# Patient Record
Sex: Female | Born: 1945 | Race: White | Hispanic: No | Marital: Single | Smoking: Never smoker
Health system: Southern US, Community
[De-identification: ages and names within clinical notes are randomized; demographics above are authoritative.]

## PROBLEM LIST (undated history)

## (undated) DIAGNOSIS — I10 Essential (primary) hypertension: Secondary | ICD-10-CM

## (undated) DIAGNOSIS — G4730 Sleep apnea, unspecified: Secondary | ICD-10-CM

## (undated) DIAGNOSIS — B029 Zoster without complications: Secondary | ICD-10-CM

## (undated) DIAGNOSIS — G2581 Restless legs syndrome: Secondary | ICD-10-CM

---

## 2014-07-21 ENCOUNTER — Emergency Department (HOSPITAL_BASED_OUTPATIENT_CLINIC_OR_DEPARTMENT_OTHER)
Admission: EM | Admit: 2014-07-21 | Discharge: 2014-07-21 | Disposition: A | Discharge status: Home / Self Care | Payer: Medicare Other | Attending: Emergency Medicine | Admitting: Emergency Medicine

## 2014-07-21 ENCOUNTER — Encounter (HOSPITAL_BASED_OUTPATIENT_CLINIC_OR_DEPARTMENT_OTHER): Payer: Self-pay | Admitting: Adult Health

## 2014-07-21 ENCOUNTER — Emergency Department (HOSPITAL_BASED_OUTPATIENT_CLINIC_OR_DEPARTMENT_OTHER): Payer: Medicare Other

## 2014-07-21 DIAGNOSIS — Z79899 Other long term (current) drug therapy: Secondary | ICD-10-CM | POA: Insufficient documentation

## 2014-07-21 DIAGNOSIS — G2581 Restless legs syndrome: Secondary | ICD-10-CM | POA: Insufficient documentation

## 2014-07-21 DIAGNOSIS — I10 Essential (primary) hypertension: Secondary | ICD-10-CM | POA: Diagnosis not present

## 2014-07-21 DIAGNOSIS — R1011 Right upper quadrant pain: Secondary | ICD-10-CM | POA: Insufficient documentation

## 2014-07-21 DIAGNOSIS — Z792 Long term (current) use of antibiotics: Secondary | ICD-10-CM | POA: Insufficient documentation

## 2014-07-21 DIAGNOSIS — K449 Diaphragmatic hernia without obstruction or gangrene: Secondary | ICD-10-CM | POA: Insufficient documentation

## 2014-07-21 DIAGNOSIS — R350 Frequency of micturition: Secondary | ICD-10-CM | POA: Insufficient documentation

## 2014-07-21 DIAGNOSIS — R110 Nausea: Secondary | ICD-10-CM

## 2014-07-21 DIAGNOSIS — Z8619 Personal history of other infectious and parasitic diseases: Secondary | ICD-10-CM | POA: Diagnosis not present

## 2014-07-21 HISTORY — DX: Essential (primary) hypertension: I10

## 2014-07-21 HISTORY — DX: Zoster without complications: B02.9

## 2014-07-21 HISTORY — DX: Restless legs syndrome: G25.81

## 2014-07-21 HISTORY — DX: Sleep apnea, unspecified: G47.30

## 2014-07-21 LAB — CBC WITH DIFFERENTIAL/PLATELET
BASOS ABS: 0 10*3/uL (ref 0.0–0.1)
Basophils Relative: 0 % (ref 0–1)
EOS ABS: 0.2 10*3/uL (ref 0.0–0.7)
Eosinophils Relative: 2 % (ref 0–5)
HEMATOCRIT: 42.3 % (ref 36.0–46.0)
Hemoglobin: 14.3 g/dL (ref 12.0–15.0)
LYMPHS PCT: 21 % (ref 12–46)
Lymphs Abs: 2.1 10*3/uL (ref 0.7–4.0)
MCH: 29.9 pg (ref 26.0–34.0)
MCHC: 33.8 g/dL (ref 30.0–36.0)
MCV: 88.3 fL (ref 78.0–100.0)
Monocytes Absolute: 0.6 10*3/uL (ref 0.1–1.0)
Monocytes Relative: 6 % (ref 3–12)
NEUTROS ABS: 7 10*3/uL (ref 1.7–7.7)
NEUTROS PCT: 71 % (ref 43–77)
Platelets: 260 10*3/uL (ref 150–400)
RBC: 4.79 MIL/uL (ref 3.87–5.11)
RDW: 13.1 % (ref 11.5–15.5)
WBC: 9.9 10*3/uL (ref 4.0–10.5)

## 2014-07-21 LAB — URINALYSIS, ROUTINE W REFLEX MICROSCOPIC
Bilirubin Urine: NEGATIVE
Glucose, UA: NEGATIVE mg/dL
KETONES UR: NEGATIVE mg/dL
NITRITE: NEGATIVE
PROTEIN: NEGATIVE mg/dL
Specific Gravity, Urine: 1.008 (ref 1.005–1.030)
UROBILINOGEN UA: 0.2 mg/dL (ref 0.0–1.0)
pH: 7.5 (ref 5.0–8.0)

## 2014-07-21 LAB — LIPASE, BLOOD: Lipase: 18 U/L — ABNORMAL LOW (ref 22–51)

## 2014-07-21 LAB — TROPONIN I: Troponin I: <0.03 ng/mL (ref <0.031)

## 2014-07-21 LAB — COMPREHENSIVE METABOLIC PANEL
ALBUMIN: 4.3 g/dL (ref 3.5–5.0)
ALT: 16 U/L (ref 14–54)
ANION GAP: 12 (ref 5–15)
AST: 19 U/L (ref 15–41)
Alkaline Phosphatase: 65 U/L (ref 38–126)
BUN: 12 mg/dL (ref 6–20)
CO2: 25 mmol/L (ref 22–32)
Calcium: 9.9 mg/dL (ref 8.9–10.3)
Chloride: 104 mmol/L (ref 101–111)
Creatinine, Ser: 0.62 mg/dL (ref 0.44–1.00)
GFR calc Af Amer: >60 mL/min (ref >60)
Glucose, Bld: 122 mg/dL — ABNORMAL HIGH (ref 65–99)
POTASSIUM: 3.9 mmol/L (ref 3.5–5.1)
Sodium: 141 mmol/L (ref 135–145)
Total Bilirubin: 0.9 mg/dL (ref 0.3–1.2)
Total Protein: 7.7 g/dL (ref 6.5–8.1)

## 2014-07-21 LAB — URINE MICROSCOPIC-ADD ON

## 2014-07-21 MED ORDER — ONDANSETRON HCL 4 MG/2ML IJ SOLN
4.0000 mg | Freq: Once | INTRAMUSCULAR | Status: AC
  Administered 2014-07-21: 4 mg via INTRAVENOUS
  Filled 2014-07-21: qty 2

## 2014-07-21 MED ORDER — IOHEXOL 300 MG/ML  SOLN
25.0000 mL | Freq: Once | INTRAMUSCULAR | Status: AC | PRN
  Administered 2014-07-21: 25 mL via ORAL

## 2014-07-21 MED ORDER — SODIUM CHLORIDE 0.9 % IV BOLUS (SEPSIS)
1000.0000 mL | Freq: Once | INTRAVENOUS | Status: AC
  Administered 2014-07-21: 1000 mL via INTRAVENOUS

## 2014-07-21 MED ORDER — IOHEXOL 300 MG/ML  SOLN
100.0000 mL | Freq: Once | INTRAMUSCULAR | Status: AC | PRN
  Administered 2014-07-21: 100 mL via INTRAVENOUS

## 2014-07-21 NOTE — ED Notes (Signed)
Pt reports that "the nausea is back.." dr. Donnald GarrePfeiffer alerted.

## 2014-07-21 NOTE — ED Provider Notes (Signed)
CSN: [id number]     Arrival date & time 07/21/14  [id number] History   First MD Initiated Contact with Patient 07/21/14 [phone]     Chief Complaint  Patient presents with  . Abdominal Pain  . Nausea     Patient is a 69 y.o. female presenting with weakness. The history is provided by the patient.  Weakness This is a new problem. The current episode started 6 to 12 hours ago. The problem has been resolved. Associated symptoms include abdominal pain. Pertinent negatives include no chest pain, no headaches and no shortness of breath. Nothing aggravates the symptoms. Nothing relieves the symptoms.  Pt reports last evening (over 6 hours ago) she had onset of "Feeling hot" she had nausea and felt generalized weakness.  She denies CP and denies any new SOB. She also reports RUQ pain for about 2 weeks She also reports urinary frequency No vomiting.  She reports frequent stools No focal weakness  She was diagnosed with shingles over 2 weeks ago (located on buttocks) and has been on hydrocodone for pain and also valtrex and also keflex She reports the shingles is improved  She reports when the episode occurred last night she had just taken her requip and also keflex  She denies h/o CAD/CVA  surg hx - cholecystectomy  Past Medical History  Diagnosis Date  . Shingles   . Sleep apnea   . Hypertension   . Restless leg syndrome    History reviewed. No pertinent past surgical history. History reviewed. No pertinent family history. History  Substance Use Topics  . Smoking status: Never Smoker   . Smokeless tobacco: Not on file  . Alcohol Use: No   OB History    No data available     Review of Systems  Respiratory: Negative for shortness of breath.   Cardiovascular: Negative for chest pain.  Gastrointestinal: Positive for abdominal pain.  Genitourinary: Positive for frequency.  Neurological: Positive for weakness. Negative for syncope, speech difficulty and headaches.  All other systems  reviewed and are negative.     Allergies  Review of patient's allergies indicates no known allergies.  Home Medications   Prior to Admission medications   Medication Sig Start Date End Date Taking? Authorizing Provider  calcium carbonate (TUMS - DOSED IN MG ELEMENTAL CALCIUM) 500 MG chewable tablet Chew 1 tablet by mouth daily.   Yes Historical Provider, MD  cephALEXin (KEFLEX) 500 MG capsule Take 500 mg by mouth 4 (four) times daily.   Yes Historical Provider, MD  HYDROcodone-acetaminophen (NORCO) 7.5-325 MG per tablet Take 1 tablet by mouth every 6 (six) hours as needed for moderate pain.   Yes Historical Provider, MD  ibuprofen (ADVIL,MOTRIN) 200 MG tablet Take 200 mg by mouth every 6 (six) hours as needed.   Yes Historical Provider, MD  irbesartan (AVAPRO) 150 MG tablet Take 150 mg by mouth daily.   Yes Historical Provider, MD  rOPINIRole (REQUIP) 0.5 MG tablet Take 0.5 mg by mouth 3 (three) times daily.   Yes Historical Provider, MD  valACYclovir (VALTREX) 1000 MG tablet Take 1,000 mg by mouth 2 (two) times daily.   Yes Historical Provider, MD   BP 181/93 mmHg  Pulse 67  Temp(Src) 99 F (37.2 C) (Oral)  Resp 20  Ht 5\' 4"  (1.626 m)  Wt 220 lb (99.791 kg)  BMI 37.74 kg/m2  SpO2 98% Physical Exam CONSTITUTIONAL: Well developed/well nourished HEAD: Normocephalic/atraumatic EYES: EOMI/PERRL ENMT: Mucous membranes moist NECK: supple no meningeal signs SPINE/BACK:entire spine  nontender CV: S1/S2 noted, no murmurs/rubs/gallops noted LUNGS: Lungs are clear to auscultation bilaterally, no apparent distress ABDOMEN: soft, mild RUQ tenderness, no rebound or guarding, bowel sounds noted throughout abdomen GU:no cva tenderness NEURO: Pt is awake/alert/appropriate, moves all extremitiesx4.  No facial droop.  No arm/leg drift EXTREMITIES: pulses normal/equal, full ROM.   SKIN: warm, color normal.  Rash on buttocks that appears to be improving, minimal erythema noted.  Nurse present for  exam PSYCH: no abnormalities of mood noted, alert and oriented to situation  ED Course  Procedures   6:58 AM Pt presents with 2 episodes of "feeling hot" and nausea during the night While in the room she reported an episode soon after my exam Her vitals remained unchanged Unclear cause of nausea/warmth/flushing.  She thought it might be med related but unclear cause at this time  D/w dr Donnald Garre at signout to f/u on labs and reassess patient.  Labs Review Labs Reviewed  COMPREHENSIVE METABOLIC PANEL  CBC WITH DIFFERENTIAL/PLATELET  LIPASE, BLOOD  TROPONIN I  URINALYSIS, ROUTINE W REFLEX MICROSCOPIC (NOT AT [facility])    Imaging Review No results found.   EKG Interpretation   Date/Time:  Monday July 21 2014 06:53:47 EDT Ventricular Rate:  58 PR Interval:  126 QRS Duration: 90 QT Interval:  432 QTC Calculation: 424 R Axis:   12 Text Interpretation:  Sinus bradycardia Nonspecific ST and T wave  abnormality Abnormal ECG No previous ECGs available Confirmed by Bebe Shaggy   MD, Dorinda Hill ([id number]) on 07/21/2014 6:56:19 AM      MDM   Final diagnoses:  None    Nursing notes including past medical history and social history reviewed and considered in documentation     Zadie Rhine, MD 07/21/14 [phone]

## 2014-07-21 NOTE — ED Notes (Signed)
MD at bedside. 

## 2014-07-21 NOTE — ED Notes (Signed)
Pt assisted to reposition, given ice water per request. Pt daughter phoned, will pick up pt shortly, pt states she does not feel well enough to wait in the waiting room.

## 2014-07-21 NOTE — ED Provider Notes (Signed)
I assumed care of the patient for Dr. Bebe ShaggyWickline. I interviewed the patient and reviewed her history present illness with episodes of nausea and weakness starting yesterday evening. The patient identified abdominal pain in her epigastrium that has been significantly bothering her for 2 or more weeks. She had been started on Keflex to prophylax a shingles rash that she had on her buttock. She had not developed vomiting or diarrhea. She has however had abdominal pain that had aching and pressure quality in the epigastrium. She reports she has had problems with reflux and has had a scope some 3 years ago. She states that she takes Prilosec daily. She reports that usually controls her symptoms for her.  On examination the patient was alert and appropriate. No respiratory distress. Cardiovascular examination is normal. Bilateral lung sounds clear without wheeze rhonchi rail. Patient endorses significant epigastric pain to palpation. No lower extremity symptoms. No pulse deficit, no edema, no calf tenderness.  Diagnostic results showed normal EKG and negative troponin. CT of the abdomen confirms a moderate to large hiatal hernia. She was moderately hypertensive upon arrival however this normalized without intervention. At this time symptoms appear most consistent with her hiatal hernia. This may have been exacerbated with the use of antibiotics.  After examining her rash on the buttock, I do not feel that she needs further Keflex. I will have her discontinue the Keflex. She is taking Prilosec daily. She is to continue this and may increase that to 40 mg a day for the next week. The patient is given instructions for follow-up. She is given return instructions.  Arby BarretteMarcy Madhuri Vacca, MD 07/21/14 [id number]

## 2014-07-21 NOTE — ED Notes (Signed)
Pt daughter Alydia Gosser now at bedside, test results reviewed with her at pt request, all questions answered. Pt and daughter verbalize understanding of all d/c instructions.

## 2014-07-21 NOTE — ED Notes (Signed)
Pt is aware of need for urine sample, will notify staff when she is able to void.

## 2014-07-21 NOTE — ED Notes (Signed)
Presents with right sided abdominal pain, SOB, nausea, "hot prickly feeling, clammy and sweaty" began last night while lying down-pt is just finishing up antiviral for shingles. Endorses frequent urination, frequent stools that are loose and tenderness to right upper and lower abdominal quadrant.

## 2014-07-21 NOTE — ED Notes (Signed)
Patient transported to CT 

## 2014-07-21 NOTE — Discharge Instructions (Signed)
Gastroesophageal Reflux Disease, Adult °Gastroesophageal reflux disease (GERD) happens when acid from your stomach flows up into the esophagus. When acid comes in contact with the esophagus, the acid causes soreness (inflammation) in the esophagus. Over time, GERD may create small holes (ulcers) in the lining of the esophagus. °CAUSES  °· Increased body weight. This puts pressure on the stomach, making acid rise from the stomach into the esophagus. °· Smoking. This increases acid production in the stomach. °· Drinking alcohol. This causes decreased pressure in the lower esophageal sphincter (valve or ring of muscle between the esophagus and stomach), allowing acid from the stomach into the esophagus. °· Late evening meals and a full stomach. This increases pressure and acid production in the stomach. °· A malformed lower esophageal sphincter. °Sometimes, no cause is found. °SYMPTOMS  °· Burning pain in the lower part of the mid-chest behind the breastbone and in the mid-stomach area. This may occur twice a week or more often. °· Trouble swallowing. °· Sore throat. °· Dry cough. °· Asthma-like symptoms including chest tightness, shortness of breath, or wheezing. °DIAGNOSIS  °Your caregiver may be able to diagnose GERD based on your symptoms. In some cases, X-rays and other tests may be done to check for complications or to check the condition of your stomach and esophagus. °TREATMENT  °Your caregiver may recommend over-the-counter or prescription medicines to help decrease acid production. Ask your caregiver before starting or adding any new medicines.  °HOME CARE INSTRUCTIONS  °· Change the factors that you can control. Ask your caregiver for guidance concerning weight loss, quitting smoking, and alcohol consumption. °· Avoid foods and drinks that make your symptoms worse, such as: °· Caffeine or alcoholic drinks. °· Chocolate. °· Peppermint or mint flavorings. °· Garlic and onions. °· Spicy foods. °· Citrus fruits,  such as oranges, lemons, or limes. °· Tomato-based foods such as sauce, chili, salsa, and pizza. °· Fried and fatty foods. °· Avoid lying down for the 3 hours prior to your bedtime or prior to taking a nap. °· Eat small, frequent meals instead of large meals. °· Wear loose-fitting clothing. Do not wear anything tight around your waist that causes pressure on your stomach. °· Raise the head of your bed 6 to 8 inches with wood blocks to help you sleep. Extra pillows will not help. °· Only take over-the-counter or prescription medicines for pain, discomfort, or fever as directed by your caregiver. °· Do not take aspirin, ibuprofen, or other nonsteroidal anti-inflammatory drugs (NSAIDs). °SEEK IMMEDIATE MEDICAL CARE IF:  °· You have pain in your arms, neck, jaw, teeth, or back. °· Your pain increases or changes in intensity or duration. °· You develop nausea, vomiting, or sweating (diaphoresis). °· You develop shortness of breath, or you faint. °· Your vomit is green, yellow, black, or looks like coffee grounds or blood. °· Your stool is red, bloody, or black. °These symptoms could be signs of other problems, such as heart disease, gastric bleeding, or esophageal bleeding. °MAKE SURE YOU:  °· Understand these instructions. °· Will watch your condition. °· Will get help right away if you are not doing well or get worse. °Document Released: 10/06/2004 Document Revised: 03/21/2011 Document Reviewed: 07/16/2010 °[employer]® Patient Information ©2015 [employer]. This information is not intended to replace advice given to you by your health care provider. Make sure you discuss any questions you have with your health care provider. ° °Hiatal Hernia °A hiatal hernia occurs when part of your stomach slides above the muscle   that separates your abdomen from your chest (diaphragm). You can be born with a hiatal hernia (congenital), or it may develop over time. In almost all cases of hiatal hernia, only the top part of the stomach  pushes through.  °Many people have a hiatal hernia with no symptoms. The larger the hernia, the more likely that you will have symptoms. In some cases, a hiatal hernia allows stomach acid to flow back into the tube that carries food from your mouth to your stomach (esophagus). This may cause heartburn symptoms. Severe heartburn symptoms may mean you have developed a condition called gastroesophageal reflux disease (GERD).  °CAUSES  °Hiatal hernias are caused by a weakness in the opening (hiatus) where your esophagus passes through your diaphragm to attach to the upper part of your stomach. You may be born with a weakness in your hiatus, or a weakness can develop. °RISK FACTORS °Older age is a major risk factor for a hiatal hernia. Anything that increases pressure on your diaphragm can also increase your risk of a hiatal hernia. This includes: °· Pregnancy. °· Excess weight. °· Frequent constipation. °SIGNS AND SYMPTOMS  °People with a hiatal hernia often have no symptoms. If symptoms develop, they are almost always caused by GERD. They may include: °· Heartburn. °· Belching. °· Indigestion. °· Trouble swallowing. °· Coughing or wheezing.  °· Sore throat. °· Hoarseness. °· Chest pain. °DIAGNOSIS  °A hiatal hernia is sometimes found during an exam for another problem. Your health care provider may suspect a hiatal hernia if you have symptoms of GERD. Tests may be done to diagnose GERD. These may include: °· X-rays of your stomach or chest. °· An upper gastrointestinal (GI) series. This is an X-ray exam of your GI tract involving the use of a chalky liquid that you swallow. The liquid shows up clearly on the X-ray. °· Endoscopy. This is a procedure to look into your stomach using a thin, flexible tube that has a tiny camera and light on the end of it. °TREATMENT  °If you have no symptoms, you may not need treatment. If you have symptoms, treatment may include: °· Dietary and lifestyle changes to help reduce GERD  symptoms. °· Medicines. These may include: °¨ Over-the-counter antacids. °¨ Medicines that make your stomach empty more quickly. °¨ Medicines that block the production of stomach acid (H2 blockers). °¨ Stronger medicines to reduce stomach acid (proton pump inhibitors). °· You may need surgery to repair the hernia if other treatments are not helping. °HOME CARE INSTRUCTIONS  °· Take all medicines as directed by your health care provider. °· Quit smoking, if you smoke. °· Try to achieve and maintain a healthy body weight. °· Eat frequent small meals instead of three large meals a day. This keeps your stomach from getting too full. °¨ Eat slowly. °¨ Do not lie down right after eating.  °¨ Do not eat 1-2 hours before bed.   °· Do not drink beverages with caffeine. These include cola, coffee, cocoa, and tea. °· Do not drink alcohol. °· Avoid foods that can make symptoms of GERD worse. These may include: °¨ Fatty foods. °¨ Citrus fruits. °¨ Other foods and drinks that contain acid. °· Avoid putting pressure on your belly. Anything that puts pressure on your belly increases the amount of acid that may be pushed up into your esophagus.   °¨ Avoid bending over, especially after eating. °¨ Raise the head of your bed by putting blocks under the legs. This keeps your head and esophagus higher than   your stomach. °¨ Do not wear tight clothing around your chest or stomach. °¨ Try not to strain when having a bowel movement, when urinating, or when lifting heavy objects. °SEEK MEDICAL CARE IF: °· Your symptoms are not controlled with medicines or lifestyle changes. °· You are having trouble swallowing. °· You have coughing or wheezing that will not go away. °SEEK IMMEDIATE MEDICAL CARE IF: °· Your pain is getting worse. °· Your pain spreads to your arms, neck, jaw, teeth, or back. °· You have shortness of breath. °· You sweat for no reason. °· You feel sick to your stomach (nauseous) or vomit. °· You vomit blood. °· You have bright  red blood in your stools. °· You have black, tarry stools.   °Document Released: 03/19/2003 Document Revised: 05/13/2013 Document Reviewed: 12/14/2012 °[employer]® Patient Information ©2015 [employer]. This information is not intended to replace advice given to you by your health care provider. Make sure you discuss any questions you have with your health care provider. ° °

## 2017-03-16 IMAGING — CT CT ABD-PELV W/ CM
2 of 5 series · 17 of 46 positions shown, 19 images · IV contrast (APPLIED)
Comparison: None.

CLINICAL DATA: 68-year-old with epigastric pain and fullness for 2
weeks.

EXAM:
CT ABDOMEN AND PELVIS WITH CONTRAST
TECHNIQUE: Multidetector CT imaging of the abdomen and pelvis was performed
using the standard protocol following bolus administration of
intravenous contrast.
CONTRAST:  25mL OMNIPAQUE IOHEXOL 300 MG/ML SOLN, 100mL OMNIPAQUE
IOHEXOL 300 MG/ML SOLN

[Series 2: abd/pelvis 5.0 b31f · axial · 0.77mm/px · z∈[+622,+1022]mm · 14 of 90 slices shown, 16 images]
[im 5/90  soft-tissue]
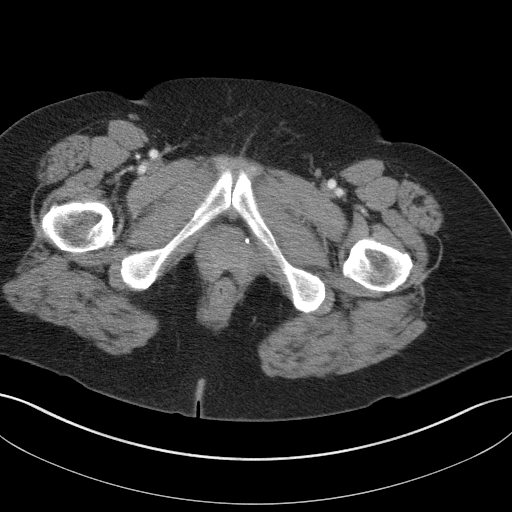
[im 5/90  bone]
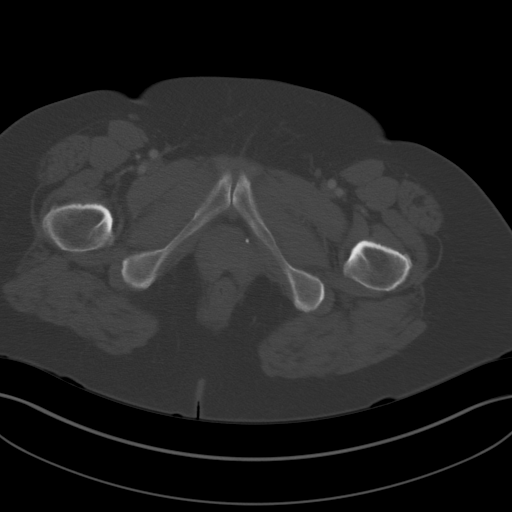
[im 10/90  soft-tissue]
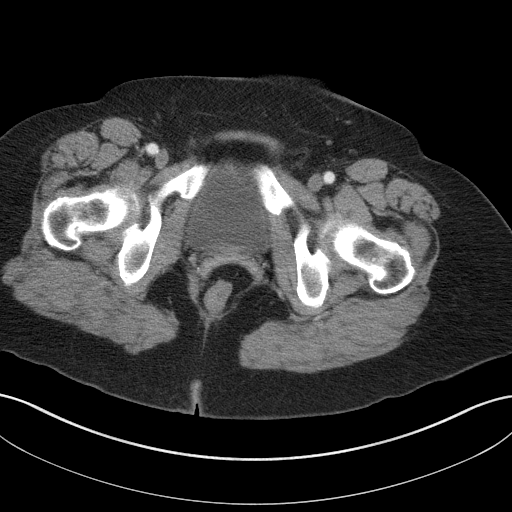
[im 19/90  soft-tissue]
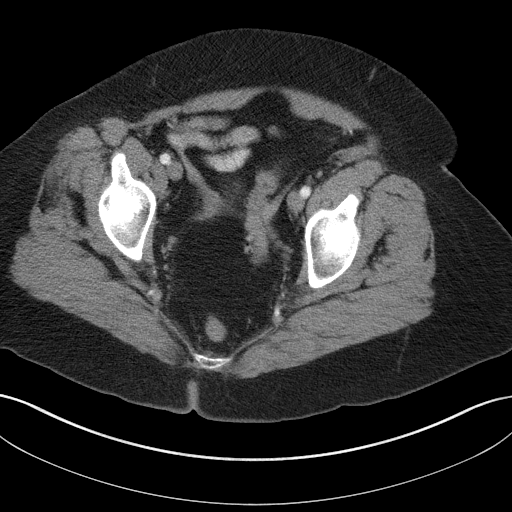
[im 24/90  soft-tissue]
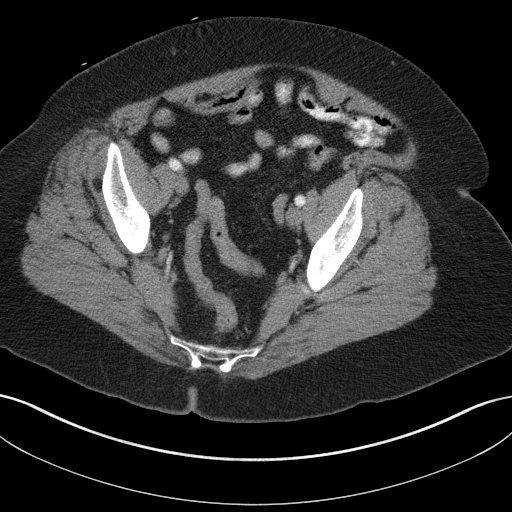
[im 29/90  soft-tissue]
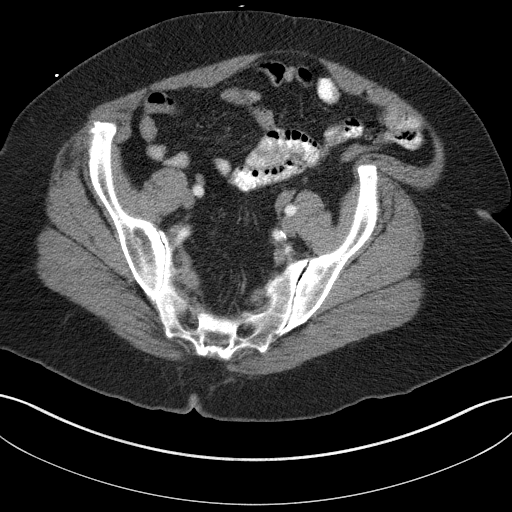
[im 38/90  soft-tissue]
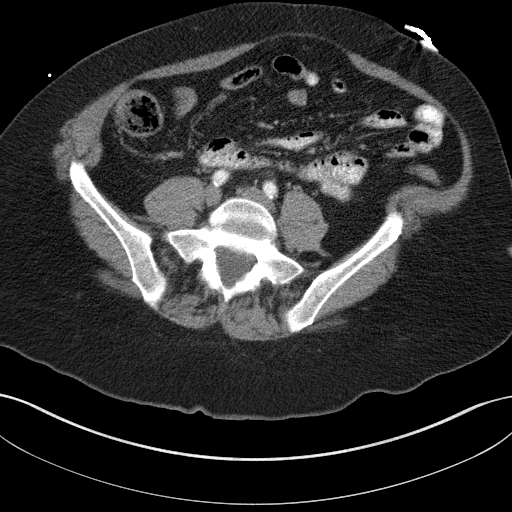
[im 43/90  soft-tissue]
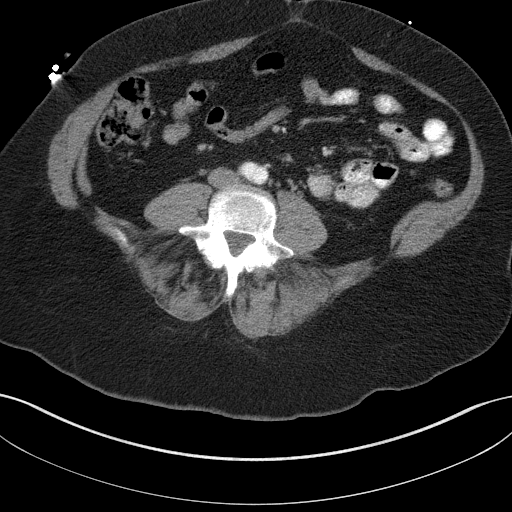
[im 47/90  soft-tissue]
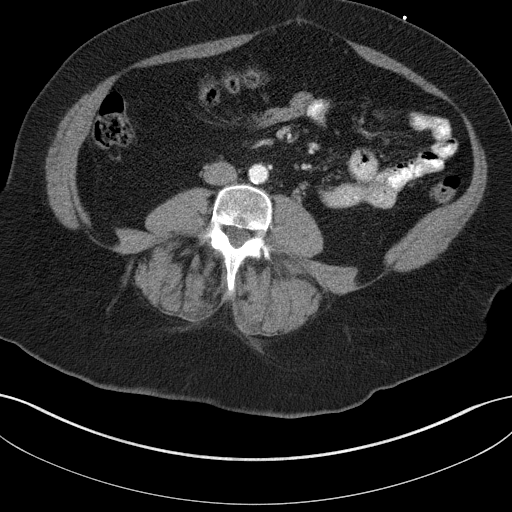
[im 52/90  soft-tissue]
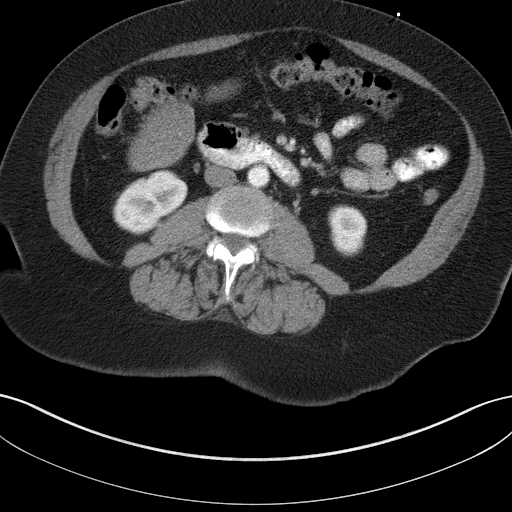
[im 52/90  bone]
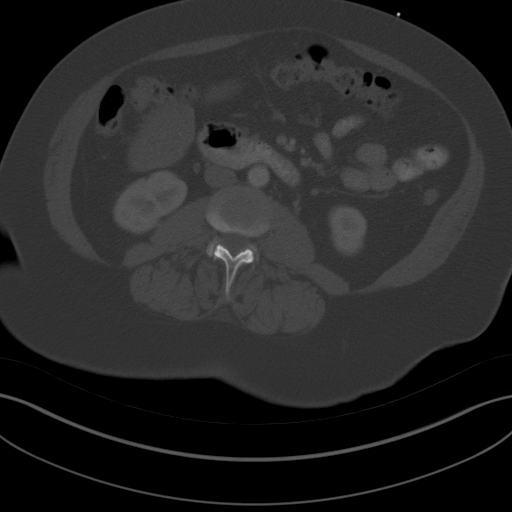
[im 61/90  soft-tissue]
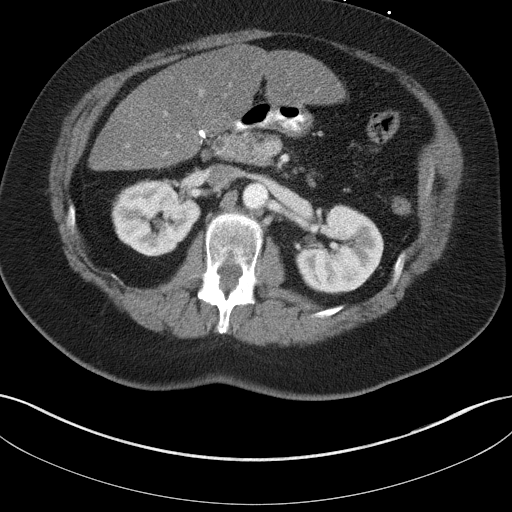
[im 66/90  soft-tissue]
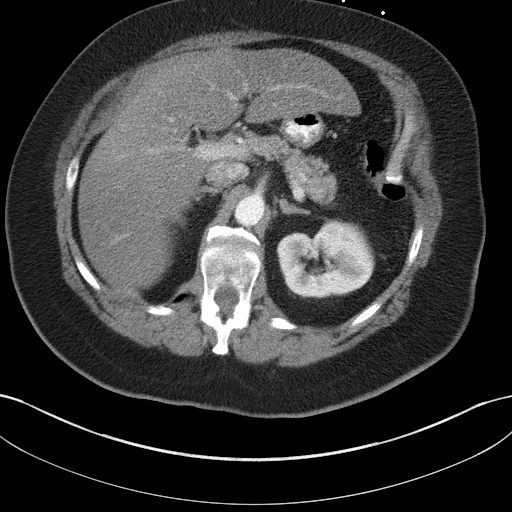
[im 71/90  soft-tissue]
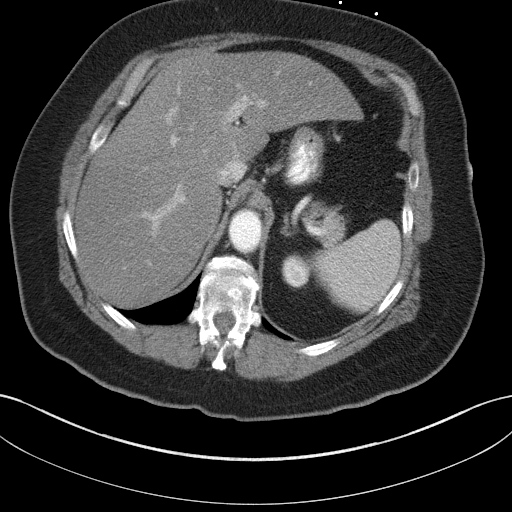
[im 80/90  soft-tissue]
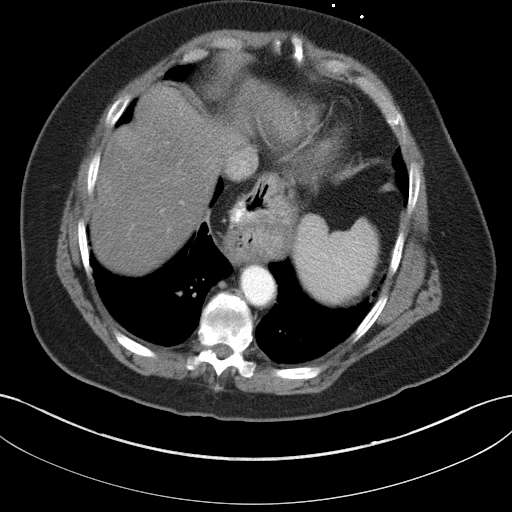
[im 85/90  soft-tissue]
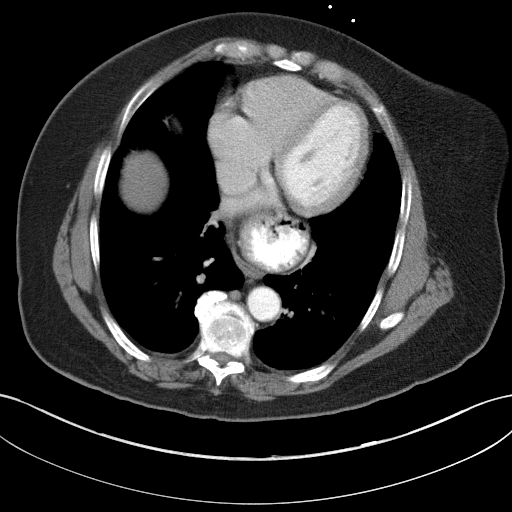

[Series 5: abd/pelvis 3.0 coronal · coronal · 0.91mm/px · 3 of 110 slices shown]
[im 37/110  soft-tissue]
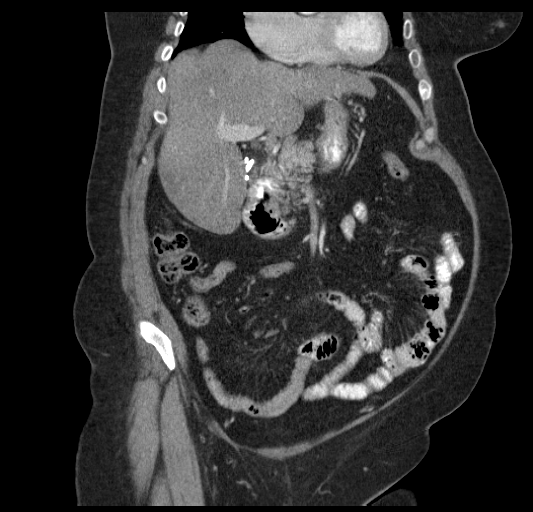
[im 49/110  soft-tissue]
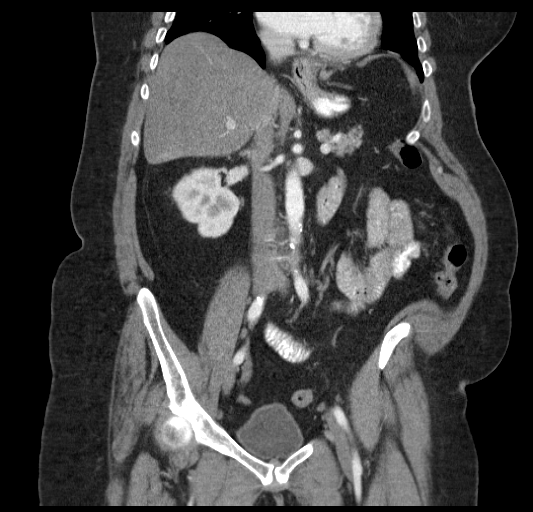
[im 61/110  soft-tissue]
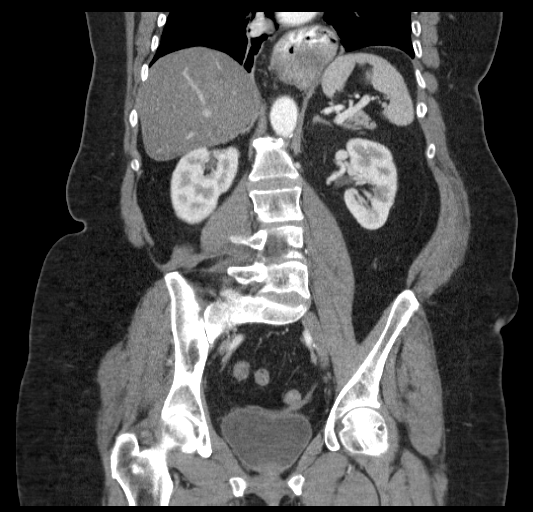

[17 of 46 positions shown; findings below may reference images not displayed]

FINDINGS: Lung bases are clear. There is a moderate-to-large sized hiatal
hernia.

No evidence for free intraperitoneal air. Low-attenuation of the
liver parenchyma is most compatible with hepatic steatosis. Portal
venous system is patent. The gallbladder has been removed. Normal
appearance of the spleen and pancreas. Normal appearance of the
duodenum. No acute abnormality to the adrenal glands or kidneys.

Atherosclerotic calcifications in the abdominal aorta without
aneurysm. There is no significant free fluid or lymphadenopathy.
Uterus is absent but there appears to be residual ovarian tissue.
Normal appearance of the urinary bladder. No acute abnormality to
the small bowel, large bowel or appendix.

No acute bone abnormality. Degenerative facet disease in lower
lumbar spine. Degenerative disc changes in the lower thoracic spine.
IMPRESSION: Moderate-to-large sized hiatal hernia.

Hepatic steatosis.
